# Patient Record
Sex: Male | Born: 1950 | Race: White | Hispanic: No | Marital: Married | Smoking: Former smoker
Health system: Southern US, Community
[De-identification: ages and names within clinical notes are randomized; demographics above are authoritative.]

## PROBLEM LIST (undated history)

## (undated) DIAGNOSIS — I10 Essential (primary) hypertension: Secondary | ICD-10-CM

## (undated) DIAGNOSIS — Z8619 Personal history of other infectious and parasitic diseases: Secondary | ICD-10-CM

## (undated) DIAGNOSIS — I6529 Occlusion and stenosis of unspecified carotid artery: Secondary | ICD-10-CM

## (undated) DIAGNOSIS — E785 Hyperlipidemia, unspecified: Secondary | ICD-10-CM

## (undated) DIAGNOSIS — M109 Gout, unspecified: Secondary | ICD-10-CM

## (undated) DIAGNOSIS — I2510 Atherosclerotic heart disease of native coronary artery without angina pectoris: Secondary | ICD-10-CM

## (undated) HISTORY — DX: Essential (primary) hypertension: I10

## (undated) HISTORY — DX: Gout, unspecified: M10.9

## (undated) HISTORY — DX: Personal history of other infectious and parasitic diseases: Z86.19

## (undated) HISTORY — DX: Atherosclerotic heart disease of native coronary artery without angina pectoris: I25.10

## (undated) HISTORY — DX: Occlusion and stenosis of unspecified carotid artery: I65.29

## (undated) HISTORY — DX: Hyperlipidemia, unspecified: E78.5

---

## 2007-09-16 DIAGNOSIS — Z8619 Personal history of other infectious and parasitic diseases: Secondary | ICD-10-CM

## 2007-09-16 HISTORY — DX: Personal history of other infectious and parasitic diseases: Z86.19

## 2008-03-09 HISTORY — PX: PERCUTANEOUS CORONARY STENT INTERVENTION (PCI-S): SHX6016

## 2008-03-10 ENCOUNTER — Observation Stay (HOSPITAL_COMMUNITY): Admission: AD | Admit: 2008-03-10 | Discharge: 2008-03-11 | Payer: Self-pay | Admitting: Cardiology

## 2011-01-28 NOTE — Cardiovascular Report (Signed)
NAMEARMOND, Reginald NO.:  [id number]   MEDICAL RECORD NO.:  [id number]          PATIENT TYPE:  OIB   LOCATION:  2807                         FACILITY:  [facility]   PHYSICIAN:  Madaline Savage, M.D.DATE OF BIRTH:  12-Mar-1951   DATE OF PROCEDURE:  DATE OF DISCHARGE:                            CARDIAC CATHETERIZATION   PRIMARY CARDIOLOGIST:  Nanetta Batty, MD   PRIMARY CARE PHYSICIAN:  Fenton Malling., MD   OPERATING PHYSICIAN:  Madaline Savage, MD   PROCEDURES PERFORMED:  1. Selective coronary angiography by Judkins technique.  2. Retrograde left heart catheterization.  3. Left ventricular angiography.  4. Direct coronary artery stenting into the proximal and mid LAD.   COMPLICATIONS:  None.   ENTRY SITE:  Right femoral dye used Omnipaque.   PATIENT PROFILE:  [name] is a 60 year old married white gentleman who  is a patient who underwent cardiac evaluation by Dr. Allyson Wright, which  included a nuclear medicine stress test, which was performed on March 08, 2008.  The patient had a history of smoking, hypertension, and chest  pain and his stress test showed moderate to severe ischemia in multiple  anterior wall segments.  His ejection fraction was felt to be 56%.  Dr.  Allyson Wright was very concerned about this gentleman and requested that we add  him on to the cath schedule for today and that was accomplished and we  finished his catheterization, somewhere around 7:00 p.m. this evening.  Those results are described below   RESULTS:  Pressures.  Left ventricular pressure was 150/3 and diastolic  pressure 8, central aortic pressure was 150/80 with a mean of 110.  No  aortic valve gradient by pullback technique.   ANGIOGRAPHIC RESULTS:  The patient's coronary artery anatomy shows no  evidence of pericardial valvular or coronary calcification.  The right  coronary artery is dominant giving rise to a large posterior descending  branch and several posterolateral  branches.  This vessel was clearly a  dominant vessel with circulation.   The left main coronary artery.  The LAD and circumflex system are  relatively small vessels ranging any where from 2.5 to approximately 3  mm in diameter.   Left main coronary artery is fairly short and shows no lesions.   LAD shows a stenotic lesion that is concentrically stenosed, right over  the first septal perforator branch and there are luminal irregularities  between first and second perforator branches.  The more distal LAD  becomes a larger vessel with lumpy-bumpy irregularities.   The circumflex is nondominant.  It is somewhat tortuous.  There is a  large first obtuse marginal branch, which has some smooth tapering  narrowings of up to 50% proximally but the distal vessel is bigger and  approaches 3 mm.  The ongoing left circumflex is nondominant, medium in  size, and shows no lesions.   The left ventricular angiogram shows no evidence of any significant wall  motion abnormalities.  Ejection fraction is estimated 55%.  No mitral  regurgitation noted.  No calcification of the aortic valve or  of the  ascending aorta.   Percutaneous coronary intervention was performed with a 6-French left  Judkins guide catheter, a Prowater wire, and a 2.75 x 50 mm Palmaz  stent, which was directly stented into the proximal and mid LAD.  It was  inflated to approximately at 14 atmospheres of pressure and was then  postdilated with a 2.75 Quantum Maverick balloon 2.75 x 12.  The result  in multiple view showed that the lesion was virtually eradicated by  angiography and the TIMI III flow was preserved.  The stenotic lesion  over the septal was gone and there was a very, very small area of  hypodensity at the end of the 15 mm long stent.  The patient tolerated  procedure well.  There was mild chest pain during the inflations and  there was also some chest pain at the end of the case, when 600 mg of  Plavix was given.   The pain at that time was felt to be Plavix related  and not related to angina.   FINAL DIAGNOSES:  1. Recent chest pain in a patient with tobacco smoking and      hypertension.  2. Essentially single-vessel coronary disease with luminal      irregularities in the proximal obtuse marginal branch and high-      grade stenosis in the proximal left anterior descending.   PLAN:  The patient will be observed overnight, will be a candidate for  discharge tomorrow.  He will follow the excellent care of Dr. Allyson Wright.           ______________________________  Madaline Savage, M.D.     WHG/MEDQ  D:  03/10/2008  T:  03/11/2008  Job:  [id number]   cc:   Fenton Malling., MD  Nanetta Batty, M.D.

## 2011-01-31 NOTE — Discharge Summary (Signed)
Reginald Wright, Reginald Wright                   ACCOUNT NO.:  [id number]   MEDICAL RECORD NO.:  [id number]          PATIENT TYPE:  INP   LOCATION:  6529                         FACILITY:  [facility]   PHYSICIAN:  Madaline Savage, M.D.DATE OF BIRTH:  Jul 12, 1951   DATE OF ADMISSION:  03/10/2008  DATE OF DISCHARGE:  03/11/2008                               DISCHARGE SUMMARY   DISCHARGE DIAGNOSES:  1. Unstable angina.  2. Abnormal Myoview study, positive for ischemia.  3. Coronary artery disease with 95% left anterior descending stenosis,      undergoing drug-eluting Promus stent to the left anterior      descending.  4. Nonobstructive coronary disease, 60% in first obtuse marginal      artery.  5. Mild left ventricular dysfunction, ejection fraction 50%.  6. Hypertension.  7. Hyperlipidemia.  8. Gout.  9. History of positive Helicobacter pylori, was treated prior to      admission.   DISCHARGE CONDITION:  Improved.   PROCEDURES:  Combined left heart catheterization with graft  visualization on March 10, 2008, by Dr. Madaline Savage.   On March 10, 2008, PTCA and stent deployment with a Promus drug-eluting  stent to the 95% stenosis of the LAD.   DISCHARGE MEDICATIONS:  1. Simcor 500/20 one nightly.  2. Aspirin 325 mg daily.  3. Metoprolol 25 mg twice a day.  4. Pylera 125/140/125 three times a day.  5. Uloric 40 mg daily.  6. Nitroglycerin as needed for chest pain.  7. Altace 5 mg daily.  8. Plavix 75 mg 1 daily, do not stop, could cause a heart attack.  9. Zantac 150 mg twice a day instead of Prilosec.   DISCHARGE INSTRUCTIONS:  1. Increase activity slowly.  May shower bathe.  No lifting for 2      days.  No driving for 2 days.  2. Wash catheterization site with soap and water.  Call if any      bleeding, swelling, or drainage.  3. The office will call you with a date for Dr. Hazle Coca appointment.  4. No work for 1 week, see work note.   HISTORY OF PRESENT ILLNESS:  A 60 year old  white married male patient,  came to Dr. Allyson Sabal previously for chest discomfort.  He underwent a  nuclear study that was positive for ischemia.  He came in and talked to  Dr. Allyson Sabal and then was sent into Short Stay, he was pain free at that  time, for cardiac catheterization.  He continues with angina that  awakens him from sleep occurring episodically over the last several  weeks, with diaphoresis, shortness of breath, radiation to neck, lasts 6  to 15 minutes, and relieves with nitroglycerin sublingual.   PAST MEDICAL HISTORY:  Hypertension, hyperlipidemia, gout, and H.  pylori, being treated.   ALLERGIES:  No known allergies.   FAMILY HISTORY, SOCIAL HISTORY, REVIEW OF SYSTEMS:  See H&P.   PHYSICAL EXAMINATION ON DISCHARGE:  Blood pressure 156/75, pulse 66,  respiratory rate 16, temperature 97, and oxygen saturation 98%.  Lungs  are clear.  No  murmur on heart exam.  Abdomen is soft and nontender.  Positive bowel sounds.  Right groin catheterization site, no bruising  and pedal pulses were intact and equal.   LABORATORY VALUES:  Hemoglobin on admission 15.1, hematocrit 43,  platelets 221, and WBC 11.3, did increase to 12.4; at discharge  hemoglobin 12.7, hematocrit 37.  Protime 13.1, INR of 1, and PTT 26.   Chemistries:  Sodium 140, potassium 4.4, chloride 105, CO2 of 25,  glucose 104, BUN 12, creatinine 0.99, that remained stable.  Glucose was  up at times.  CK ranged 42, MB 1.1, and troponin I of 0.03.  Followup CK  was 91, MB 7.9, and troponin 0.24 secondary to procedure.   EKG:  Sinus rhythm with anterolateral ischemia.  On March 11, 2008,  followup EKG, sinus rhythm with no further EKG changes.   HOSPITAL COURSE:  The patient came over, essentially as an outpatient  with a positive nuclear study with episodes of angina.  He was scheduled  for cardiac catheterization, which he underwent, and was found to have  95% LAD stenosis, underwent PTCA and stent deployment, tolerated  the  procedure well, only kept overnight in observation.  By the next day, he  was ambulating without problems, and was felt ready for discharge home.  He will follow up as an outpatient.      Reginald Wright. Reginald Wright, N.P.    ______________________________  Madaline Savage, M.D.    LRI/MEDQ  D:  04/06/2008  T:  04/07/2008  Job:  [id number]   cc:   Madaline Savage, M.D.  Nanetta Batty, M.D.  Pasty Spillers. Ivory Broad, MD

## 2011-06-12 LAB — CBC
HCT: 37.8 — ABNORMAL LOW
Hemoglobin: 12.7 — ABNORMAL LOW
MCV: 90.1
Platelets: 201
Platelets: 221
RDW: 13
RDW: 13

## 2011-06-12 LAB — PROTIME-INR
INR: 1
Prothrombin Time: 13.1

## 2011-06-12 LAB — CARDIAC PANEL(CRET KIN+CKTOT+MB+TROPI)
CK, MB: 7.9 — ABNORMAL HIGH
Troponin I: 0.03

## 2011-06-12 LAB — BASIC METABOLIC PANEL
BUN: 12
BUN: 9
Calcium: 9.9
Creatinine, Ser: 0.99
Creatinine, Ser: 1.08
GFR calc non Af Amer: >60
GFR calc non Af Amer: >60
Glucose, Bld: 104 — ABNORMAL HIGH
Glucose, Bld: 170 — ABNORMAL HIGH
Sodium: 140

## 2011-06-12 LAB — APTT: aPTT: 26

## 2012-11-12 ENCOUNTER — Encounter: Payer: Self-pay | Admitting: Cardiology

## 2012-11-12 DIAGNOSIS — Z8739 Personal history of other diseases of the musculoskeletal system and connective tissue: Secondary | ICD-10-CM

## 2012-11-12 DIAGNOSIS — I10 Essential (primary) hypertension: Secondary | ICD-10-CM

## 2012-11-12 DIAGNOSIS — I2510 Atherosclerotic heart disease of native coronary artery without angina pectoris: Secondary | ICD-10-CM | POA: Insufficient documentation

## 2012-11-12 DIAGNOSIS — E785 Hyperlipidemia, unspecified: Secondary | ICD-10-CM | POA: Insufficient documentation

## 2012-11-12 DIAGNOSIS — I779 Disorder of arteries and arterioles, unspecified: Secondary | ICD-10-CM | POA: Insufficient documentation

## 2013-05-02 ENCOUNTER — Encounter: Payer: Self-pay | Admitting: Cardiovascular Disease

## 2013-05-06 ENCOUNTER — Other Ambulatory Visit: Payer: Self-pay | Admitting: *Deleted

## 2013-05-06 DIAGNOSIS — I779 Disorder of arteries and arterioles, unspecified: Secondary | ICD-10-CM

## 2013-05-10 ENCOUNTER — Telehealth (HOSPITAL_COMMUNITY): Payer: Self-pay | Admitting: Cardiovascular Disease

## 2013-05-19 ENCOUNTER — Telehealth (HOSPITAL_COMMUNITY): Payer: Self-pay | Admitting: Cardiovascular Disease

## 2013-05-24 ENCOUNTER — Telehealth (HOSPITAL_COMMUNITY): Payer: Self-pay | Admitting: *Deleted

## 2013-05-24 ENCOUNTER — Encounter (HOSPITAL_COMMUNITY): Payer: Self-pay | Admitting: *Deleted

## 2013-05-26 ENCOUNTER — Telehealth (HOSPITAL_COMMUNITY): Payer: Self-pay | Admitting: *Deleted

## 2013-05-26 ENCOUNTER — Encounter (HOSPITAL_COMMUNITY): Payer: Self-pay | Admitting: *Deleted

## 2013-06-02 ENCOUNTER — Ambulatory Visit (HOSPITAL_COMMUNITY)
Admission: RE | Admit: 2013-06-02 | Discharge: 2013-06-02 | Disposition: A | Discharge status: Home / Self Care | Payer: BC Managed Care – PPO | Source: Ambulatory Visit | Attending: Internal Medicine | Admitting: Internal Medicine

## 2013-06-02 DIAGNOSIS — I779 Disorder of arteries and arterioles, unspecified: Secondary | ICD-10-CM

## 2013-06-02 NOTE — Progress Notes (Signed)
Carotid Duplex Completed. °Brianna L Mazza,RVT °

## 2013-06-15 ENCOUNTER — Ambulatory Visit (INDEPENDENT_AMBULATORY_CARE_PROVIDER_SITE_OTHER): Payer: BC Managed Care – PPO | Admitting: Cardiovascular Disease

## 2013-06-15 ENCOUNTER — Encounter: Payer: Self-pay | Admitting: Cardiovascular Disease

## 2013-06-15 VITALS — BP 150/80 | HR 66 | Ht 70.0 in | Wt 166.0 lb

## 2013-06-15 DIAGNOSIS — I2510 Atherosclerotic heart disease of native coronary artery without angina pectoris: Secondary | ICD-10-CM

## 2013-06-15 DIAGNOSIS — I10 Essential (primary) hypertension: Secondary | ICD-10-CM

## 2013-06-15 DIAGNOSIS — E785 Hyperlipidemia, unspecified: Secondary | ICD-10-CM

## 2013-06-15 DIAGNOSIS — I779 Disorder of arteries and arterioles, unspecified: Secondary | ICD-10-CM

## 2013-06-15 NOTE — Assessment & Plan Note (Signed)
Moderate right, mild left ICA stenosis stable a duplex ultrasound. He is neurologically asymptomatic on aspirin and Plavix.

## 2013-06-15 NOTE — Assessment & Plan Note (Signed)
Status post proximal LAD PCI and stenting using a drug-eluting stent by Dr. Lavonne Chick 03/10/08. He did have a 60% obtuse marginal branch lesion which elected to treat medically. He had a Myoview stress test performed 06/06/08 is nonischemic. He denies chest pain or shortness of breath.

## 2013-06-15 NOTE — Assessment & Plan Note (Signed)
Under good control on current medications 

## 2013-06-15 NOTE — Assessment & Plan Note (Signed)
Excellent results on statin therapy

## 2013-06-15 NOTE — Patient Instructions (Addendum)
Your physician wants you to follow-up in: 1 year with Dr Berry. You will receive a reminder letter in the mail two months in advance. If you don't receive a letter, please call our office to schedule the follow-up appointment.  

## 2013-06-15 NOTE — Progress Notes (Signed)
06/15/2013 Reginald Wright   [birth year]  [id number]  Primary Physician Dorris Singh, DO Primary Cardiologist: Runell Gess MD Reginald Wright   HPI:  The patient is a 62 year old mildly overweight married Caucasian male father of 1 who I last saw in the office a year ago. He has a history of CAD status post PCI and stenting of his proximal LAD with a Promus drug-eluting stent by Dr. Lavonne Chick on March 10, 2008. He did have a 60% OM lesion which we elected to treat medically. He had a Myoview stress test performed June 06, 2008 that was nonischemic. His other problems include hypertension, hyperlipidemia and peripheral vascular disease with carotid Dopplers performed because of an auscultated bruit in May 2012 that showed moderate right ICA stenosis in the 60% range. He remains neurologically asymptomatic. Most recent Dopplers performed in May showed no change. His most recent lipid profile performed recently revealed a total cholesterol of 143, LDL of 70 and HDL of 61. Since I saw him in the office a year ago he has remained completely asymptomatic. He hasn't retire from his previous employment where he worked for 37 years.     Current Outpatient Prescriptions  Medication Sig Dispense Refill  . allopurinol (ZYLOPRIM) 300 MG tablet Take 300 mg by mouth daily.      Marland Kitchen aspirin 325 MG tablet Take 162 mg by mouth daily.      . clopidogrel (PLAVIX) 75 MG tablet Take 75 mg by mouth daily.      . metoprolol tartrate (LOPRESSOR) 25 MG tablet Take 25 mg by mouth 2 (two) times daily.      . niacin-simvastatin (SIMCOR) 500-20 MG 24 hr tablet Take 1 tablet by mouth at bedtime.      Marland Kitchen NITROSTAT 0.6 MG SL tablet Place 0.6 mg under the tongue every 5 (five) minutes as needed.       . ramipril (ALTACE) 5 MG tablet Take 5 mg by mouth daily.      . ranitidine (ZANTAC) 150 MG capsule Take 150 mg by mouth 2 (two) times daily.       No current facility-administered medications for this visit.     No Known Allergies  History   Social History  . Marital Status: Married    Spouse Name: N/A    Number of Children: 1  . Years of Education: N/A   Occupational History  . retired Producer, television/film/video   Social History Main Topics  . Smoking status: Former Smoker    Quit date: 11/12/1992  . Smokeless tobacco: Not on file  . Alcohol Use: 12.6 oz/week    21 Cans of beer per week  . Drug Use: No  . Sexual Activity: Not on file   Other Topics Concern  . Not on file   Social History Narrative  . No narrative on file     Review of Systems: General: negative for chills, fever, night sweats or weight changes.  Cardiovascular: negative for chest pain, dyspnea on exertion, edema, orthopnea, palpitations, paroxysmal nocturnal dyspnea or shortness of breath Dermatological: negative for rash Respiratory: negative for cough or wheezing Urologic: negative for hematuria Abdominal: negative for nausea, vomiting, diarrhea, bright red blood per rectum, melena, or hematemesis Neurologic: negative for visual changes, syncope, or dizziness All other systems reviewed and are otherwise negative except as noted above.    Blood pressure 150/80, pulse 66, height 5\' 10"  (1.778 m), weight 166 lb (75.297 kg).  General appearance: alert and  no distress Neck: no adenopathy, no carotid bruit, no JVD, supple, symmetrical, trachea midline and thyroid not enlarged, symmetric, no tenderness/mass/nodules Lungs: clear to auscultation bilaterally Heart: regular rate and rhythm, S1, S2 normal, no murmur, click, rub or gallop Extremities: extremities normal, atraumatic, no cyanosis or edema  EKG normal sinus rhythm at 66 with left anterior fascicular block  ASSESSMENT AND PLAN:   CAD (coronary artery disease), hx. promus DES 02/2008 to LAD, neg. nuc 05/2008, residual 60% OM treated medically  Status post proximal LAD PCI and stenting using a drug-eluting stent by Dr. Lavonne Chick 03/10/08. He  did have a 60% obtuse marginal branch lesion which elected to treat medically. He had a Myoview stress test performed 06/06/08 is nonischemic. He denies chest pain or shortness of breath.  HTN (hypertension) Under good control on current medications  Hyperlipidemia Excellent results on statin therapy  Carotid artery disease, mod Rt. ICA Moderate right, mild left ICA stenosis stable a duplex ultrasound. He is neurologically asymptomatic on aspirin and Plavix.      Runell Gess MD FACP,FACC,FAHA, [facility] 06/15/2013 2:59 PM

## 2013-06-17 ENCOUNTER — Telehealth: Payer: Self-pay | Admitting: *Deleted

## 2013-06-17 DIAGNOSIS — I6529 Occlusion and stenosis of unspecified carotid artery: Secondary | ICD-10-CM

## 2013-06-17 NOTE — Telephone Encounter (Signed)
Order placed for repeat carotid doppler in one year 

## 2013-06-17 NOTE — Telephone Encounter (Signed)
Message copied by Marella Bile on Fri Jun 17, 2013 11:24 AM ------      Message from: Runell Gess      Created: Wed Jun 15, 2013  9:02 PM       No change from prior study. Repeat in 12 months. ------

## 2014-05-09 ENCOUNTER — Other Ambulatory Visit (HOSPITAL_COMMUNITY): Payer: Self-pay | Admitting: Cardiovascular Disease

## 2014-05-09 DIAGNOSIS — I6529 Occlusion and stenosis of unspecified carotid artery: Secondary | ICD-10-CM

## 2014-06-07 ENCOUNTER — Ambulatory Visit (HOSPITAL_COMMUNITY)
Admission: RE | Admit: 2014-06-07 | Discharge: 2014-06-07 | Disposition: A | Discharge status: Home / Self Care | Payer: BC Managed Care – PPO | Source: Ambulatory Visit | Attending: Cardiology | Admitting: Cardiology

## 2014-06-07 DIAGNOSIS — I6529 Occlusion and stenosis of unspecified carotid artery: Secondary | ICD-10-CM | POA: Insufficient documentation

## 2014-06-07 NOTE — Progress Notes (Signed)
Carotid Duplex Completed. °Brianna L Mazza,RVT °

## 2014-06-13 ENCOUNTER — Encounter (HOSPITAL_COMMUNITY): Payer: BC Managed Care – PPO

## 2014-06-16 ENCOUNTER — Encounter: Payer: Self-pay | Admitting: *Deleted

## 2014-06-20 ENCOUNTER — Ambulatory Visit: Payer: BC Managed Care – PPO | Admitting: Cardiovascular Disease

## 2014-07-28 ENCOUNTER — Ambulatory Visit (INDEPENDENT_AMBULATORY_CARE_PROVIDER_SITE_OTHER): Payer: BC Managed Care – PPO | Admitting: Cardiovascular Disease

## 2014-07-28 ENCOUNTER — Encounter: Payer: Self-pay | Admitting: Cardiovascular Disease

## 2014-07-28 VITALS — BP 116/60 | HR 57 | Ht 70.0 in | Wt 167.0 lb

## 2014-07-28 DIAGNOSIS — I6529 Occlusion and stenosis of unspecified carotid artery: Secondary | ICD-10-CM

## 2014-07-28 DIAGNOSIS — I779 Disorder of arteries and arterioles, unspecified: Secondary | ICD-10-CM

## 2014-07-28 DIAGNOSIS — E785 Hyperlipidemia, unspecified: Secondary | ICD-10-CM

## 2014-07-28 DIAGNOSIS — I10 Essential (primary) hypertension: Secondary | ICD-10-CM

## 2014-07-28 DIAGNOSIS — I739 Peripheral vascular disease, unspecified: Secondary | ICD-10-CM

## 2014-07-28 DIAGNOSIS — I2510 Atherosclerotic heart disease of native coronary artery without angina pectoris: Secondary | ICD-10-CM

## 2014-07-28 DIAGNOSIS — I2583 Coronary atherosclerosis due to lipid rich plaque: Secondary | ICD-10-CM

## 2014-07-28 NOTE — Assessment & Plan Note (Signed)
History of hypertension with blood pressure today measured at 116/60. He is on metoprolol and ramipril. We will keep him on the same drugs at the same doses

## 2014-07-28 NOTE — Assessment & Plan Note (Signed)
History of carotid artery disease with recent Doppler performed 06/07/14 revealing moderate right ICA stenosis unchanged from the prior study. He is on dual antiplatelet therapy and is neurologically a symptomatically. We will continue to follow his carotids by duplex ultrasound on an annual basis.

## 2014-07-28 NOTE — Patient Instructions (Addendum)
Your physician wants you to follow-up in: January 2017. You will receive a reminder letter in the mail two months in advance. If you don't receive a letter, please call our office to schedule the follow-up appointment.  Carotid Duplex (January 2017)- This test is an ultrasound of the carotid arteries in your neck. It looks at blood flow through these arteries that supply the brain with blood. Allow one hour for this exam. There are no restrictions or special instructions.

## 2014-07-28 NOTE — Assessment & Plan Note (Signed)
History of CAD status post stenting of his proximal LAD by Dr. Janene Madeira 03/10/08 with a Promus drug-eluting stent. He did have a 60% OM lesion which was treated medically. His last Myoview performed 06/06/08 was nonischemic. He denies chest pain or shortness of breath.

## 2014-07-28 NOTE — Assessment & Plan Note (Signed)
History of hyperlipidemia on Simcor. His most recent lipid profile performed 04/21/14 revealed a total cholesterol 152, LDL of 69 and HDL 64. This is excellent for secondary prevention. We'll keep him on his current medications

## 2014-07-28 NOTE — Progress Notes (Signed)
07/28/2014 Ernestene Mention Thull   March 26, 1951  [id number]  Primary Physician Anselmo Pickler, DO Primary Cardiologist: Lorretta Harp MD Renae Gloss   HPI:  The patient is a 63 year old mildly overweight married Caucasian male father of 1 who I last saw in the office a year ago. He has a history of CAD status post PCI and stenting of his proximal LAD with a Promus drug-eluting stent by Dr. Janene Madeira on March 10, 2008. He did have a 60% OM lesion which we elected to treat medically. He had a Myoview stress test performed June 06, 2008 that was nonischemic. His other problems include hypertension, hyperlipidemia and peripheral vascular disease with carotid Dopplers performed because of an auscultated bruit in May 2012 that showed moderate right ICA stenosis in the 60% range. He remains neurologically asymptomatic. His most recent carotid Dopplers performed 06/07/14 remained stable moderate right internal carotid artery stenosis. His most recent lipid profile performed 04/21/14 revealed a total cholesterol 152, LDL 69 and HDL of 64 he is completely asymptomatic.    Current Outpatient Prescriptions  Medication Sig Dispense Refill  . allopurinol (ZYLOPRIM) 300 MG tablet Take 300 mg by mouth daily.    Marland Kitchen aspirin 81 MG tablet Take 81 mg by mouth daily.    . clopidogrel (PLAVIX) 75 MG tablet Take 75 mg by mouth daily.    . metoprolol tartrate (LOPRESSOR) 25 MG tablet Take 25 mg by mouth 2 (two) times daily.    . niacin-simvastatin (SIMCOR) 500-20 MG 24 hr tablet Take 1 tablet by mouth at bedtime.    Marland Kitchen NITROSTAT 0.6 MG SL tablet Place 0.6 mg under the tongue every 5 (five) minutes as needed.     . ramipril (ALTACE) 5 MG tablet Take 5 mg by mouth daily.    . ranitidine (ZANTAC) 150 MG capsule Take 150 mg by mouth 2 (two) times daily.     No current facility-administered medications for this visit.    No Known Allergies  History   Social History  . Marital Status: Married    Spouse  Name: N/A    Number of Children: 1  . Years of Education: N/A   Occupational History  . retired Clinical research associate   Social History Main Topics  . Smoking status: Former Smoker    Quit date: 11/12/1992  . Smokeless tobacco: Not on file  . Alcohol Use: 12.6 oz/week    21 Cans of beer per week  . Drug Use: No  . Sexual Activity: Not on file   Other Topics Concern  . Not on file   Social History Narrative     Review of Systems: General: negative for chills, fever, night sweats or weight changes.  Cardiovascular: negative for chest pain, dyspnea on exertion, edema, orthopnea, palpitations, paroxysmal nocturnal dyspnea or shortness of breath Dermatological: negative for rash Respiratory: negative for cough or wheezing Urologic: negative for hematuria Abdominal: negative for nausea, vomiting, diarrhea, bright red blood per rectum, melena, or hematemesis Neurologic: negative for visual changes, syncope, or dizziness All other systems reviewed and are otherwise negative except as noted above.    Blood pressure 116/60, pulse 57, height 5\' 10"  (1.778 m), weight 167 lb (75.751 kg).  General appearance: alert and no distress Neck: no adenopathy, no JVD, supple, symmetrical, trachea midline, thyroid not enlarged, symmetric, no tenderness/mass/nodules and Soft right carotid bruit Lungs: clear to auscultation bilaterally Heart: regular rate and rhythm, S1, S2 normal, no murmur, click, rub or gallop  Extremities: extremities normal, atraumatic, no cyanosis or edema  EKG sinus bradycardia at 57 with left anterior fascicular block. I personally reviewed this EKG  ASSESSMENT AND PLAN:   CAD (coronary artery disease), hx. promus DES 02/2008 to LAD, neg. nuc 05/2008, residual 60% OM treated medically  History of CAD status post stenting of his proximal LAD by Dr. Janene Madeira 03/10/08 with a Promus drug-eluting stent. He did have a 60% OM lesion which was treated medically. His last  Myoview performed 06/06/08 was nonischemic. He denies chest pain or shortness of breath.  HTN (hypertension) History of hypertension with blood pressure today measured at 116/60. He is on metoprolol and ramipril. We will keep him on the same drugs at the same doses  Carotid artery disease, mod Rt. ICA History of carotid artery disease with recent Doppler performed 06/07/14 revealing moderate right ICA stenosis unchanged from the prior study. He is on dual antiplatelet therapy and is neurologically a symptomatically. We will continue to follow his carotids by duplex ultrasound on an annual basis.  Hyperlipidemia History of hyperlipidemia on Simcor. His most recent lipid profile performed 04/21/14 revealed a total cholesterol 152, LDL of 69 and HDL 64. This is excellent for secondary prevention. We'll keep him on his current medications      Lorretta Harp MD [facility], [facility] 07/28/2014 3:54 PM

## 2014-12-21 ENCOUNTER — Telehealth: Payer: Self-pay | Admitting: Cardiovascular Disease

## 2014-12-21 NOTE — Telephone Encounter (Signed)
Pt needs OK to hold Plavix for upcoming colonoscopy.

## 2014-12-21 NOTE — Telephone Encounter (Signed)
Merrilee Seashore called in wanting to see if the pt could hold his plavix 7 days before a colonoscopy which will be scheduled in May. Please f/u with him  Thanks

## 2014-12-24 NOTE — Telephone Encounter (Signed)
OK to hold plavix for colonoscpoy 

## 2014-12-25 NOTE — Telephone Encounter (Signed)
Spoke w/ Merrilee Seashore at GI office - he voiced understanding, thanks for callback.

## 2015-04-25 ENCOUNTER — Telehealth: Payer: Self-pay | Admitting: Cardiovascular Disease

## 2015-04-25 NOTE — Telephone Encounter (Signed)
Reginald Wright is wanting to know should he discontinue with the Plavix while he is going thru Chemo and Radiation . Please call  Thanks

## 2015-04-25 NOTE — Telephone Encounter (Signed)
      [name] is wanting to know should he discontinue with the Plavix while he is going through Chemo and Radiation .  Oncologist feels that it will be fine but patient would like approval from Dr. Gwenlyn Found  Please advise

## 2015-04-25 NOTE — Telephone Encounter (Signed)
Yes, continue plavix

## 2015-04-25 NOTE — Telephone Encounter (Signed)
Instructed patient to continue Plavix as prescribed

## 2015-07-05 ENCOUNTER — Other Ambulatory Visit: Payer: Self-pay | Admitting: Cardiovascular Disease

## 2015-07-05 DIAGNOSIS — I6523 Occlusion and stenosis of bilateral carotid arteries: Secondary | ICD-10-CM

## 2015-07-12 ENCOUNTER — Ambulatory Visit (HOSPITAL_COMMUNITY)
Admission: RE | Admit: 2015-07-12 | Discharge: 2015-07-12 | Disposition: A | Discharge status: Home / Self Care | Payer: 59 | Source: Ambulatory Visit | Attending: Cardiology | Admitting: Cardiology

## 2015-07-12 DIAGNOSIS — I6523 Occlusion and stenosis of bilateral carotid arteries: Secondary | ICD-10-CM | POA: Diagnosis present

## 2015-07-12 DIAGNOSIS — I10 Essential (primary) hypertension: Secondary | ICD-10-CM | POA: Diagnosis not present

## 2015-07-12 DIAGNOSIS — E785 Hyperlipidemia, unspecified: Secondary | ICD-10-CM | POA: Insufficient documentation

## 2015-07-20 ENCOUNTER — Telehealth: Payer: Self-pay | Admitting: Cardiovascular Disease

## 2015-07-20 NOTE — Telephone Encounter (Signed)
Received records from [place] for appointment with Dr Gwenlyn Found on 07/31/15.  Records given to [facility] (medical records) for appointment on 07/31/15.  lp

## 2015-07-26 ENCOUNTER — Encounter: Payer: Self-pay | Admitting: Cardiovascular Disease

## 2015-07-31 ENCOUNTER — Encounter: Payer: Self-pay | Admitting: Cardiovascular Disease

## 2015-07-31 ENCOUNTER — Telehealth (HOSPITAL_COMMUNITY): Payer: Self-pay | Admitting: *Deleted

## 2015-07-31 ENCOUNTER — Ambulatory Visit (INDEPENDENT_AMBULATORY_CARE_PROVIDER_SITE_OTHER): Payer: 59 | Admitting: Cardiovascular Disease

## 2015-07-31 VITALS — BP 134/70 | HR 62 | Ht 70.0 in | Wt 165.0 lb

## 2015-07-31 DIAGNOSIS — Z01818 Encounter for other preprocedural examination: Secondary | ICD-10-CM | POA: Diagnosis not present

## 2015-07-31 DIAGNOSIS — I10 Essential (primary) hypertension: Secondary | ICD-10-CM | POA: Diagnosis not present

## 2015-07-31 DIAGNOSIS — I779 Disorder of arteries and arterioles, unspecified: Secondary | ICD-10-CM

## 2015-07-31 DIAGNOSIS — I739 Peripheral vascular disease, unspecified: Secondary | ICD-10-CM

## 2015-07-31 DIAGNOSIS — E785 Hyperlipidemia, unspecified: Secondary | ICD-10-CM | POA: Diagnosis not present

## 2015-07-31 DIAGNOSIS — I2583 Coronary atherosclerosis due to lipid rich plaque: Secondary | ICD-10-CM

## 2015-07-31 DIAGNOSIS — I2510 Atherosclerotic heart disease of native coronary artery without angina pectoris: Secondary | ICD-10-CM

## 2015-07-31 NOTE — Assessment & Plan Note (Signed)
History of hyperlipidemia currently not taking his statin drug at the recommendation of his PCP

## 2015-07-31 NOTE — Assessment & Plan Note (Signed)
History of hypertension blood pressure measurements at 134/70. He is on metoprolol and ramipril. Continue current meds at current dosing

## 2015-07-31 NOTE — Assessment & Plan Note (Signed)
History of moderate right ICA stenosis followed by duplex ultrasound most recently in our office 07/12/15. He is neurologically symptomatically. We will continue to follow this by ultrasound annually

## 2015-07-31 NOTE — Patient Instructions (Signed)
Medication Instructions:  Your physician recommends that you continue on your current medications as directed. Please refer to the Current Medication list given to you today.   Labwork: I will get your lab work from your Primary Care Physician.   Testing/Procedures: Your physician has requested that you have en exercise stress myoview. For further information please visit [email or web address]. Please follow instruction sheet, as given.   Follow-Up: Your physician wants you to follow-up in: 12 months with Dr. Gwenlyn Found. You will receive a reminder letter in the mail two months in advance. If you don't receive a letter, please call our office to schedule the follow-up appointment.   Any Other Special Instructions Will Be Listed Below (If Applicable).     If you need a refill on your cardiac medications before your next appointment, please call your pharmacy.

## 2015-07-31 NOTE — Telephone Encounter (Signed)
Patient given detailed instructions per Myocardial Perfusion Study Information Sheet for the test on 08/02/15 at 1230. Patient notified to arrive 15 minutes early and that it is imperative to arrive on time for appointment to keep from having the test rescheduled.  If you need to cancel or reschedule your appointment, please call the office within 24 hours of your appointment. Failure to do so may result in a cancellation of your appointment, and a $50 no show fee. Patient verbalized understanding.Tamiko Leopard, Ranae Palms

## 2015-07-31 NOTE — Progress Notes (Signed)
07/31/2015 Ernestene Mention [place]   [birth year]  [id number]  Primary Physician Daphene Calamity, MD Primary Cardiologist: Lorretta Harp MD Renae Gloss   HPI:  The patient is a 64 year old mildly overweight married Caucasian male father of 1 who I last saw in the office a year ago. He has a history of CAD status post PCI and stenting of his proximal LAD with a Promus drug-eluting stent by Dr. Janene Madeira on March 10, 2008. He did have a 60% OM lesion which we elected to treat medically. He had a Myoview stress test performed June 06, 2008 that was nonischemic. His other problems include hypertension, hyperlipidemia and peripheral vascular disease with carotid Dopplers performed because of an auscultated bruit in May 2012 that showed moderate right ICA stenosis in the 60% range. He remains neurologically asymptomatic. His most recent carotid Dopplers performed 07/12/15 remained stable moderate right internal carotid artery stenosis. His lipid profile followed by his PCP. Unfortunately, he was diagnosed with stage II anorectal cancer in June. He's had limited surgical resection, chemotherapy and radiation therapy and scheduled to have a divergent colostomy on 08/10/15. He denies chest pain or shortness of breath.   Current Outpatient Prescriptions  Medication Sig Dispense Refill  . allopurinol (ZYLOPRIM) 300 MG tablet Take 300 mg by mouth daily.    Marland Kitchen aspirin 81 MG tablet Take 81 mg by mouth daily.    . clopidogrel (PLAVIX) 75 MG tablet Take 75 mg by mouth daily.    . metoprolol tartrate (LOPRESSOR) 25 MG tablet Take 25 mg by mouth 2 (two) times daily.    . niacin-simvastatin (SIMCOR) 500-20 MG 24 hr tablet Take 1 tablet by mouth at bedtime.    Marland Kitchen NITROSTAT 0.6 MG SL tablet Place 0.6 mg under the tongue every 5 (five) minutes as needed.     . ramipril (ALTACE) 5 MG tablet Take 5 mg by mouth daily.    . ranitidine (ZANTAC) 150 MG capsule Take 150 mg by mouth 2 (two) times daily.      No current facility-administered medications for this visit.    No Known Allergies  Social History   Social History  . Marital Status: Married    Spouse Name: N/A  . Number of Children: 1  . Years of Education: N/A   Occupational History  . retired Clinical research associate   Social History Main Topics  . Smoking status: Former Smoker    Quit date: 11/12/1992  . Smokeless tobacco: Not on file  . Alcohol Use: 12.6 oz/week    21 Cans of beer per week  . Drug Use: No  . Sexual Activity: Not on file   Other Topics Concern  . Not on file   Social History Narrative     Review of Systems: General: negative for chills, fever, night sweats or weight changes.  Cardiovascular: negative for chest pain, dyspnea on exertion, edema, orthopnea, palpitations, paroxysmal nocturnal dyspnea or shortness of breath Dermatological: negative for rash Respiratory: negative for cough or wheezing Urologic: negative for hematuria Abdominal: negative for nausea, vomiting, diarrhea, bright red blood per rectum, melena, or hematemesis Neurologic: negative for visual changes, syncope, or dizziness All other systems reviewed and are otherwise negative except as noted above.    Blood pressure 134/70, pulse 62, height 5\' 10"  (1.778 m), weight 165 lb (74.844 kg).  General appearance: alert and no distress Neck: no adenopathy, no JVD, supple, symmetrical, trachea midline, thyroid not enlarged, symmetric, no tenderness/mass/nodules and soft right  carotid bruit Lungs: clear to auscultation bilaterally Heart: regular rate and rhythm, S1, S2 normal, no murmur, click, rub or gallop Extremities: extremities normal, atraumatic, no cyanosis or edema  EKG sinus rhythm at 62 without ST or T-wave changes. I personally reviewed this EKG  ASSESSMENT AND PLAN:   Hyperlipidemia History of hyperlipidemia currently not taking his statin drug at the recommendation of his PCP  HTN (hypertension) History  of hypertension blood pressure measurements at 134/70. He is on metoprolol and ramipril. Continue current meds at current dosing  Carotid artery disease, mod Rt. ICA History of moderate right ICA stenosis followed by duplex ultrasound most recently in our office 07/12/15. He is neurologically symptomatically. We will continue to follow this by ultrasound annually  CAD (coronary artery disease), hx. promus DES 02/2008 to LAD, neg. nuc 05/2008, residual 60% OM treated medically  History of CAD status post proximal LAD PCI and stenting using a drug eluding stent by Dr. Janene Madeira 03/10/08. He did have a 60% obtuse marginal branch lesion which we elected to treat medically. His last Myoview performed 06/04/08 was nonischemic. He scheduled to have anorectal surgery for cancer on the 25th of this month. We'll get an exercise Myoview to risk stratify him.      Lorretta Harp MD FACP,FACC,FAHA, [facility] 07/31/2015 8:27 AM

## 2015-07-31 NOTE — Assessment & Plan Note (Signed)
History of CAD status post proximal LAD PCI and stenting using a drug eluding stent by Dr. Janene Madeira 03/10/08. He did have a 60% obtuse marginal branch lesion which we elected to treat medically. His last Myoview performed 06/04/08 was nonischemic. He scheduled to have anorectal surgery for cancer on the 25th of this month. We'll get an exercise Myoview to risk stratify him.

## 2015-08-02 ENCOUNTER — Ambulatory Visit (HOSPITAL_COMMUNITY): Payer: 59 | Attending: Cardiovascular Disease

## 2015-08-02 DIAGNOSIS — Z01818 Encounter for other preprocedural examination: Secondary | ICD-10-CM | POA: Diagnosis not present

## 2015-08-02 DIAGNOSIS — I2510 Atherosclerotic heart disease of native coronary artery without angina pectoris: Secondary | ICD-10-CM

## 2015-08-02 DIAGNOSIS — I10 Essential (primary) hypertension: Secondary | ICD-10-CM | POA: Insufficient documentation

## 2015-08-02 DIAGNOSIS — I779 Disorder of arteries and arterioles, unspecified: Secondary | ICD-10-CM | POA: Diagnosis not present

## 2015-08-02 LAB — MYOCARDIAL PERFUSION IMAGING
CHL CUP MPHR: 156 {beats}/min
CHL CUP NUCLEAR SDS: 0
CHL CUP NUCLEAR SRS: 4
CHL CUP NUCLEAR SSS: 4
CHL RATE OF PERCEIVED EXERTION: 17
CSEPEW: 8.5 METS
CSEPHR: 93 %
Exercise duration (min): 7 min
Exercise duration (sec): 0 s
LHR: 0.28
LV sys vol: 29 mL
LVDIAVOL: 83 mL
Peak HR: 146 {beats}/min
Rest HR: 71 {beats}/min
TID: 0.89

## 2015-08-02 MED ORDER — TECHNETIUM TC 99M SESTAMIBI GENERIC - CARDIOLITE
11.0000 | Freq: Once | INTRAVENOUS | Status: AC | PRN
  Administered 2015-08-02: 11 via INTRAVENOUS

## 2015-08-02 MED ORDER — TECHNETIUM TC 99M SESTAMIBI GENERIC - CARDIOLITE
32.5000 | Freq: Once | INTRAVENOUS | Status: AC | PRN
  Administered 2015-08-02: 33 via INTRAVENOUS

## 2015-08-03 ENCOUNTER — Telehealth: Payer: Self-pay | Admitting: Cardiovascular Disease

## 2015-08-03 NOTE — Telephone Encounter (Signed)
Follow up ° °Pt returned call  °

## 2015-08-03 NOTE — Telephone Encounter (Signed)
Left message for patient to call back  

## 2015-08-03 NOTE — Telephone Encounter (Signed)
Pt is calling back in to speak with Maudie Mercury, Dr. Kennon Holter nurse  Thanks

## 2015-08-03 NOTE — Telephone Encounter (Signed)
Returning your call from yesterday. °

## 2015-08-03 NOTE — Telephone Encounter (Signed)
Pt made aware of results no questions at this time. Pt was low risk on Myoview, is pt cleared for surgery?  [place]  [phone] Fax: [phone] Attn: Alyson Locket Dr. Beverely Pace

## 2015-08-05 NOTE — Telephone Encounter (Signed)
Cleared for surgery at low risk, OK to interrupt anti playelet Rx

## 2015-08-06 NOTE — Telephone Encounter (Signed)
Pt clearance sent to Dr. Sharin Grave office.

## 2015-08-07 ENCOUNTER — Ambulatory Visit: Payer: 59 | Admitting: Cardiovascular Disease

## 2015-09-04 ENCOUNTER — Telehealth: Payer: Self-pay | Admitting: Cardiovascular Disease

## 2015-09-04 NOTE — Telephone Encounter (Signed)
Please call,question about his medicine. °

## 2015-09-04 NOTE — Telephone Encounter (Signed)
Recommendations given to patient, he is aware to call if any further concerns.

## 2015-09-04 NOTE — Telephone Encounter (Signed)
OK to stay off of plavix

## 2015-09-04 NOTE — Telephone Encounter (Signed)
Left message for patient to call.

## 2015-09-04 NOTE — Telephone Encounter (Signed)
Pt w hx DES placed in 2009. Notes he has been off Plavix since major surgery last month, wants to know whether or not to resume again.  States he has been scheduled to start chemotherapy in January - wonders if the Plavix needs to be restarted at this time -  Pt concerned for complications/interactions rt med & would prefer to minimize the number of meds he is on daily.  Pt aware I will defer to Dr. Gwenlyn Found.

## 2016-06-06 ENCOUNTER — Other Ambulatory Visit: Payer: Self-pay | Admitting: Cardiovascular Disease

## 2016-06-06 DIAGNOSIS — I6523 Occlusion and stenosis of bilateral carotid arteries: Secondary | ICD-10-CM

## 2016-07-14 ENCOUNTER — Ambulatory Visit (HOSPITAL_COMMUNITY)
Admission: RE | Admit: 2016-07-14 | Discharge: 2016-07-14 | Disposition: A | Discharge status: Home / Self Care | Payer: Medicare Other | Source: Ambulatory Visit | Attending: Cardiology | Admitting: Cardiology

## 2016-07-14 DIAGNOSIS — Z87891 Personal history of nicotine dependence: Secondary | ICD-10-CM | POA: Diagnosis not present

## 2016-07-14 DIAGNOSIS — I6523 Occlusion and stenosis of bilateral carotid arteries: Secondary | ICD-10-CM | POA: Diagnosis present

## 2016-07-14 DIAGNOSIS — E785 Hyperlipidemia, unspecified: Secondary | ICD-10-CM | POA: Insufficient documentation

## 2016-07-14 DIAGNOSIS — I2510 Atherosclerotic heart disease of native coronary artery without angina pectoris: Secondary | ICD-10-CM | POA: Insufficient documentation

## 2016-07-14 DIAGNOSIS — I10 Essential (primary) hypertension: Secondary | ICD-10-CM | POA: Insufficient documentation

## 2016-07-22 ENCOUNTER — Telehealth: Payer: Self-pay | Admitting: *Deleted

## 2016-07-22 DIAGNOSIS — I779 Disorder of arteries and arterioles, unspecified: Secondary | ICD-10-CM

## 2016-07-22 DIAGNOSIS — I739 Peripheral vascular disease, unspecified: Principal | ICD-10-CM

## 2016-07-22 NOTE — Telephone Encounter (Signed)
-----   Message from Lorretta Harp, MD sent at 07/20/2016  4:54 PM EST ----- No change from prior study. Repeat in 12 months.

## 2016-08-26 ENCOUNTER — Ambulatory Visit: Payer: Medicare Other | Admitting: Cardiovascular Disease

## 2016-09-10 ENCOUNTER — Telehealth: Payer: Self-pay | Admitting: Cardiovascular Disease

## 2016-09-10 NOTE — Telephone Encounter (Signed)
Received records from [facility] for appointment on 09/24/16 with Dr Gwenlyn Found.  Records given to [facility] (medical records) for Dr Kennon Holter schedule on 09/24/16. lp

## 2016-09-16 ENCOUNTER — Other Ambulatory Visit: Payer: Self-pay

## 2016-09-24 ENCOUNTER — Encounter: Payer: Self-pay | Admitting: Cardiovascular Disease

## 2016-09-24 ENCOUNTER — Ambulatory Visit (INDEPENDENT_AMBULATORY_CARE_PROVIDER_SITE_OTHER): Payer: Medicare Other | Admitting: Cardiovascular Disease

## 2016-09-24 VITALS — BP 130/68 | HR 71 | Ht 70.0 in | Wt 167.0 lb

## 2016-09-24 DIAGNOSIS — I779 Disorder of arteries and arterioles, unspecified: Secondary | ICD-10-CM | POA: Diagnosis not present

## 2016-09-24 DIAGNOSIS — I2510 Atherosclerotic heart disease of native coronary artery without angina pectoris: Secondary | ICD-10-CM | POA: Diagnosis not present

## 2016-09-24 DIAGNOSIS — I10 Essential (primary) hypertension: Secondary | ICD-10-CM

## 2016-09-24 DIAGNOSIS — I739 Peripheral vascular disease, unspecified: Secondary | ICD-10-CM

## 2016-09-24 DIAGNOSIS — E7800 Pure hypercholesterolemia, unspecified: Secondary | ICD-10-CM

## 2016-09-24 NOTE — Progress Notes (Signed)
09/24/2016 Ernestene Mention [place]   11-14-1950  [id number]  Primary Physician Daphene Calamity, MD Primary Cardiologist: Lorretta Harp MD Reginald Wright  HPI:  The patient is a 66 year old mildly overweight married Caucasian male father of 1 who I last saw in the office 07/31/15. He has a history of CAD status post PCI and stenting of his proximal LAD with a Promus drug-eluting stent by Dr. Janene Madeira on March 10, 2008. He did have a 60% OM lesion which we elected to treat medically. He had a Myoview stress test performed June 06, 2008 that was nonischemic. His other problems include hypertension, hyperlipidemia and peripheral vascular disease with carotid Dopplers performed because of an auscultated bruit in May 2012 that showed moderate right ICA stenosis in the 60% range. He remains neurologically asymptomatic. His most recent carotid Dopplers performed 07/12/15 remained stable moderate right internal carotid artery stenosis. His lipid profile followed by his PCP. Unfortunately, he was diagnosed with stage II anorectal cancer in June. He's had limited surgical resection, chemotherapy and radiation therapy and had a diverging colostomy on 08/10/15. He denies chest pain or shortness of breath. He did have a Myoview stress test performed for preoperative clearance 08/02/15 which was nonischemic.   Current Outpatient Prescriptions  Medication Sig Dispense Refill  . allopurinol (ZYLOPRIM) 300 MG tablet Take 300 mg by mouth daily.    Marland Kitchen aspirin 81 MG tablet Take 81 mg by mouth daily.    . metoprolol tartrate (LOPRESSOR) 25 MG tablet Take 25 mg by mouth 2 (two) times daily.    Marland Kitchen NITROSTAT 0.6 MG SL tablet Place 0.6 mg under the tongue every 5 (five) minutes as needed.     . ondansetron (ZOFRAN-ODT) 4 MG disintegrating tablet DIS 1 T ON THE TONGUE Q 8 H PRN NAUSEA OR VOM  0  . ramipril (ALTACE) 5 MG tablet Take 5 mg by mouth daily.    . tamsulosin (FLOMAX) 0.4 MG CAPS capsule   6   No  current facility-administered medications for this visit.     No Known Allergies  Social History   Social History  . Marital status: Married    Spouse name: N/A  . Number of children: 1  . Years of education: N/A   Occupational History  . retired Clinical research associate   Social History Main Topics  . Smoking status: Former Smoker    Quit date: 11/12/1992  . Smokeless tobacco: Never Used  . Alcohol use 12.6 oz/week    21 Cans of beer per week  . Drug use: No  . Sexual activity: Not on file   Other Topics Concern  . Not on file   Social History Narrative  . No narrative on file     Review of Systems: General: negative for chills, fever, night sweats or weight changes.  Cardiovascular: negative for chest pain, dyspnea on exertion, edema, orthopnea, palpitations, paroxysmal nocturnal dyspnea or shortness of breath Dermatological: negative for rash Respiratory: negative for cough or wheezing Urologic: negative for hematuria Abdominal: negative for nausea, vomiting, diarrhea, bright red blood per rectum, melena, or hematemesis Neurologic: negative for visual changes, syncope, or dizziness All other systems reviewed and are otherwise negative except as noted above.    Blood pressure 130/68, pulse 71, height 5\' 10"  (1.778 m), weight 167 lb (75.8 kg).  General appearance: alert and no distress Neck: no adenopathy, no carotid bruit, no JVD, supple, symmetrical, trachea midline and thyroid not enlarged, symmetric,  no tenderness/mass/nodules Lungs: clear to auscultation bilaterally Heart: regular rate and rhythm, S1, S2 normal, no murmur, click, rub or gallop Extremities: extremities normal, atraumatic, no cyanosis or edema  EKG normal sinus rhythm at 71 with left anterior vesicular block and lateral Q waves. I personally reviewed his EKG  ASSESSMENT AND PLAN:   CAD (coronary artery disease), hx. promus DES 02/2008 to LAD, neg. nuc 05/2008, residual 60% OM treated  medically  History of coronary artery disease status post LAD PCI and drug-eluting stenting by Dr. Janene Madeira 03/10/08. He did have a 60% obtuse marginal branch stenosis which was elected to treat medically. His most recent Myoview stress test done for preoperative clearance 08/02/15 was nonischemic. He denies chest pain or shortness of breath.  HTN (hypertension) History of hypertension blood pressure measures had 130/68. He is on metoprolol and enalapril. Continue current meds at current dosing  Hyperlipidemia History of hyperlipidemia not on statin therapy followed by his PCP  Carotid artery disease, mod Rt. ICA History of moderate right ICA stenosis at its ultrasound left chest 07/14/16. He is scheduled to have this rechecked in October of this year. He has neurologically asymptomatic.      Lorretta Harp MD FACP,FACC,FAHA, [facility] 09/24/2016 3:54 PM

## 2016-09-24 NOTE — Assessment & Plan Note (Signed)
History of hypertension blood pressure measures had 130/68. He is on metoprolol and enalapril. Continue current meds at current dosing

## 2016-09-24 NOTE — Assessment & Plan Note (Signed)
History of moderate right ICA stenosis at its ultrasound left chest 07/14/16. He is scheduled to have this rechecked in October of this year. He has neurologically asymptomatic.

## 2016-09-24 NOTE — Assessment & Plan Note (Signed)
History of hyperlipidemia not on statin therapy followed by his PCP 

## 2016-09-24 NOTE — Assessment & Plan Note (Signed)
History of coronary artery disease status post LAD PCI and drug-eluting stenting by Dr. Janene Madeira 03/10/08. He did have a 60% obtuse marginal branch stenosis which was elected to treat medically. His most recent Myoview stress test done for preoperative clearance 08/02/15 was nonischemic. He denies chest pain or shortness of breath.

## 2016-09-24 NOTE — Patient Instructions (Signed)
Medication Instructions: Your physician recommends that you continue on your current medications as directed. Please refer to the Current Medication list given to you today.   Labwork: I will request labs from Dr. Shelda Altes  Testing:  Schedule Carotid duplex for 06/2017   Follow-Up: Your physician wants you to follow-up in: 1 year with Dr. Gwenlyn Found. You will receive a reminder letter in the mail two months in advance. If you don't receive a letter, please call our office to schedule the follow-up appointment.  If you need a refill on your cardiac medications before your next appointment, please call your pharmacy.

## 2016-11-16 IMAGING — NM NM MYOCAR MULTI W/ SPECT
3 series · 18 of 18 positions shown · non-contrast
Comparison: none

[Series 1: rest · 6.51mm/px · 6 of 64 frames shown]
[frame 6/64]
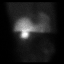
[frame 16/64]
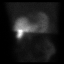
[frame 27/64]
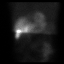
[frame 38/64]
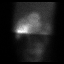
[frame 48/64]
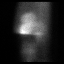
[frame 59/64]
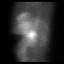

[Series 2: stress · 6.51mm/px · 6 of 64 frames shown (1 of 2)]
[frame 6/64]
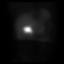
[frame 16/64]
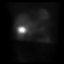
[frame 27/64]
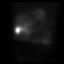
[frame 38/64]
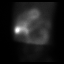
[frame 48/64]
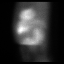
[frame 59/64]
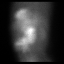

[Series 2: stress · 6.51mm/px · 6 of 512 frames shown (2 of 2)]
[frame 43/512]
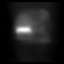
[frame 128/512]
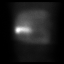
[frame 214/512]
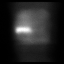
[frame 299/512]
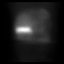
[frame 384/512]
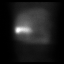
[frame 470/512]
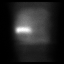

[18 of 18 positions shown; findings below may reference images not displayed]

Canned report from images found in remote index.

Refer to host system for actual result text.

## 2017-07-06 ENCOUNTER — Inpatient Hospital Stay (HOSPITAL_COMMUNITY): Admission: RE | Admit: 2017-07-06 | Payer: Medicare Other | Source: Ambulatory Visit

## 2017-08-12 ENCOUNTER — Ambulatory Visit (HOSPITAL_COMMUNITY)
Admission: RE | Admit: 2017-08-12 | Discharge: 2017-08-12 | Disposition: A | Discharge status: Home / Self Care | Payer: Medicare Other | Source: Ambulatory Visit | Attending: Cardiology | Admitting: Cardiology

## 2017-08-12 DIAGNOSIS — I779 Disorder of arteries and arterioles, unspecified: Secondary | ICD-10-CM

## 2017-08-12 DIAGNOSIS — I6523 Occlusion and stenosis of bilateral carotid arteries: Secondary | ICD-10-CM | POA: Diagnosis not present

## 2017-08-12 DIAGNOSIS — I739 Peripheral vascular disease, unspecified: Secondary | ICD-10-CM

## 2017-08-14 ENCOUNTER — Other Ambulatory Visit: Payer: Self-pay | Admitting: Cardiovascular Disease

## 2017-08-14 DIAGNOSIS — I6521 Occlusion and stenosis of right carotid artery: Secondary | ICD-10-CM

## 2018-08-18 ENCOUNTER — Ambulatory Visit (HOSPITAL_COMMUNITY)
Admission: RE | Admit: 2018-08-18 | Discharge: 2018-08-18 | Disposition: A | Discharge status: Home / Self Care | Payer: Medicare Other | Source: Ambulatory Visit | Attending: Cardiology | Admitting: Cardiology

## 2018-08-18 ENCOUNTER — Other Ambulatory Visit: Payer: Self-pay | Admitting: *Deleted

## 2018-08-18 DIAGNOSIS — I6521 Occlusion and stenosis of right carotid artery: Secondary | ICD-10-CM | POA: Insufficient documentation

## 2019-08-19 ENCOUNTER — Encounter (HOSPITAL_COMMUNITY): Payer: Medicare Other

## 2019-10-04 ENCOUNTER — Other Ambulatory Visit: Payer: Self-pay

## 2019-10-04 ENCOUNTER — Other Ambulatory Visit (HOSPITAL_COMMUNITY): Payer: Self-pay | Admitting: Cardiovascular Disease

## 2019-10-04 ENCOUNTER — Ambulatory Visit (HOSPITAL_COMMUNITY)
Admission: RE | Admit: 2019-10-04 | Discharge: 2019-10-04 | Disposition: A | Discharge status: Home / Self Care | Payer: Medicare Other | Source: Ambulatory Visit | Attending: Cardiovascular Disease | Admitting: Cardiovascular Disease

## 2019-10-04 DIAGNOSIS — I6521 Occlusion and stenosis of right carotid artery: Secondary | ICD-10-CM | POA: Diagnosis not present

## 2019-10-04 DIAGNOSIS — I6523 Occlusion and stenosis of bilateral carotid arteries: Secondary | ICD-10-CM

## 2020-08-29 ENCOUNTER — Other Ambulatory Visit: Payer: Self-pay | Admitting: Hematology and Oncology

## 2020-08-29 DIAGNOSIS — C787 Secondary malignant neoplasm of liver and intrahepatic bile duct: Secondary | ICD-10-CM

## 2020-08-30 ENCOUNTER — Ambulatory Visit
Admission: RE | Admit: 2020-08-30 | Discharge: 2020-08-30 | Disposition: A | Discharge status: Home / Self Care | Payer: Medicare Other | Source: Ambulatory Visit | Attending: Hematology and Oncology | Admitting: Hematology and Oncology

## 2020-08-30 ENCOUNTER — Other Ambulatory Visit: Payer: Self-pay

## 2020-08-30 ENCOUNTER — Encounter: Payer: Self-pay | Admitting: *Deleted

## 2020-08-30 DIAGNOSIS — C20 Malignant neoplasm of rectum: Secondary | ICD-10-CM

## 2020-08-30 HISTORY — PX: IR RADIOLOGIST EVAL & MGMT: IMG5224

## 2020-08-30 NOTE — Consult Note (Signed)
Chief Complaint: Metastatic rectal cancer.  Referring Physician(s): Chinnasami,Bernard  History of Present Illness: Reginald Wright is a 69 y.o. male with past medical history significant for CAD (post coronary stent placement), carotid stenosis, hypertension and hyperlipidemia who is seen today in telemedicine consultation evaluation of potential percutaneous management for rectal cancer, metastatic to the liver.  Hehas had neoadjuvant chemoradiation therapy in the past in the adjuvant settingandFOLOFOX therapy post resection. Unfortunately now has stage IV diseaseand isonAvastin plus FOLFIRI therapy however despite treatment, recent PET/CT demonstrates progression of size and metabolic activity associated with the solitary lesion replacing majority of the left lobe of the liver.  Negative for definitive evidence of extrahepatic metastatic disease as left upper lobe pulmonary nodule is currently without metabolic activity.  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  He states that the symptoms are much improved as of late as he has undergone a chemotherapy holiday.  Patient is interested in pursuing all available treatment options.    Past Medical History:  Diagnosis Date  . CAD (coronary artery disease)    HX. stent 2009 (also last cath), last nuc.stress 06/06/2008 improved, non ischemic secondary to stent and EF 66%   . Carotid stenosis, asymptomatic    mod. Rt. ICA -60% by doppler, + catotid bruit  . Gout   . History of Helicobacter pylori infection 2009   treated  . HTN (hypertension)   . Hyperlipidemia     Past Surgical History:  Procedure Laterality Date  . PERCUTANEOUS CORONARY STENT INTERVENTION (PCI-S)  03/09/2008   Promus, DES to LAD, residual disease 60% OM lesion treated medically    Allergies: Patient has no known allergies.  Medications: Prior to  Admission medications   Medication Sig Start Date End Date Taking? Authorizing Provider  allopurinol (ZYLOPRIM) 300 MG tablet Take 300 mg by mouth daily.    [provider]  aspirin 81 MG tablet Take 81 mg by mouth daily.    [provider]  metoprolol tartrate (LOPRESSOR) 25 MG tablet Take 25 mg by mouth 2 (two) times daily.    [provider]  NITROSTAT 0.6 MG SL tablet Place 0.6 mg under the tongue every 5 (five) minutes as needed.  04/28/13   [provider]  ondansetron (ZOFRAN-ODT) 4 MG disintegrating tablet DIS 1 T ON THE TONGUE Q 8 H PRN NAUSEA OR VOM 07/30/16   [provider]  ramipril (ALTACE) 5 MG tablet Take 5 mg by mouth daily.    [provider]  tamsulosin (FLOMAX) 0.4 MG CAPS capsule  08/17/16   [provider]     No family history on file.  Social History   Socioeconomic History  . Marital status: Married    Spouse name: Not on file  . Number of children: 1  . Years of education: Not on file  . Highest education level: Not on file  Occupational History  . Occupation: retired Ambulance person: [employer]  Tobacco Use  . Smoking status: Former Smoker    Quit date: 11/12/1992    Years since quitting: 27.8  . Smokeless tobacco: Never Used  Substance and Sexual Activity  . Alcohol use: Yes    Alcohol/week: 21.0 standard drinks    Types: 21 Cans of beer per week  . Drug use: No  . Sexual activity: Not on file  Other Topics Concern  . Not on  file  Social History Narrative  . Not on file   Social Determinants of Health   Financial Resource Strain: Not on file  Food Insecurity: Not on file  Transportation Needs: Not on file  Physical Activity: Not on file  Stress: Not on file  Social Connections: Not on file    ECOG Status: 2 - Symptomatic, <50% confined to bed  Review of Systems  Review of Systems: A 12 point ROS discussed and pertinent positives are indicated in the HPI  above.  All other systems are negative.  Physical Exam No direct physical exam was performed (except for noted visual exam findings with Video Visits).   Vital Signs: There were no vitals taken for this visit.  Imaging:  PET CT - 08/17/2020; 05/15/2020; 01/31/2020  PET/CT demonstrates interval increase in size of the now approximately 8.3 x 5.3 cm hypoattenuating peripherally hypermetabolic lesion involving the majority of the left lobe of the liver, previously, 4.7 x 3.5 cm on PET/CT performed 05/15/2020.  There is a minimal amount of atherosclerotic plaque involving the origin of the common trunk of the SMA and celiac artery.  Moderate amount of mixed calcified and noncalcified atherosclerotic plaque throughout the normal caliber abdominal aorta, including evaluating this noncontrast examination.   Labs:  CBC: No results for input(s): WBC, HGB, HCT, PLT in the last 8760 hours.  COAGS: No results for input(s): INR, APTT in the last 8760 hours.  BMP: No results for input(s): NA, K, CL, CO2, GLUCOSE, BUN, CALCIUM, CREATININE, GFRNONAA, GFRAA in the last 8760 hours.  Invalid input(s): CMP  LIVER FUNCTION TESTS:  Bilirubin - 0.4 (08/27/2020)   TUMOR MARKERS: No results for input(s): AFPTM, CEA, CA199, CHROMGRNA in the last 8760 hours.  Assessment and Plan:  Reginald Wright is a 69 y.o. male with past medical history significant for CAD (post coronary stent placement), carotid stenosis, hypertension and hyperlipidemia who is seen today in telemedicine consultation evaluation of potential percutaneous management for rectal cancer, metastatic to the liver.  Personal review of PET/CT performed 08/17/2020 demonstrates interval increase in size of the now approximately 8.3 x 5.3 cm hypoattenuating peripherally hypermetabolic lesion involving the majority of the left lobe of the liver, previously, 4.7 x 3.5 cm on PET/CT performed 05/15/2020.  There is a minimal amount of atherosclerotic  plaque involving the origin of the common trunk of the SMA and celiac artery.  Moderate amount of mixed calcified and noncalcified atherosclerotic plaque throughout the normal caliber abdominal aorta, including evaluating this noncontrast examination.  Patient's LFTs remain normal (last labs from 08/27/20).  Given the size of the hypermetabolic liver lesion (greater than 3 cm), the patient is NOT a candidate for ablation therapy.  As such, prolonged conversations were held with the patient regarding potential transcatheter treatment options, specifically, Y 90 radioembolization.  It was explained that transcatheter treatment options are performed for palliative and not curative purposes.  The hope is that the embolization will result in disease stability and/or improvement hopefully for several months time.  Benefits and risks (including but not limited to bleeding, arterial injury, nontarget embolization and worsening hepatic function) was discussed at length with the patient.   I explained that the Y 90 radio embolization entails a mapping procedure at which time vessels taking blood supply away from the liver may require embolization followed by a planning dose of nontherapeutic radioactivity to ensure nonhepatic arterial distribution and that there is not a significant hepato-pulmonary shunt.  Following this prolonged and detailed conversation the patient  and the patient's wife wish to pursue evaluation for Y 90 radioembolization.  As such, we will initially proceed with obtaining a preprocedural planning CTA of the abdomen and pelvis.  Ultimately, both the planning and eventual Y 90 radioembolization will be performed at [facility] with conscious sedation.  All procedures were performed on an outpatient basis.  The patient and the patient's wife demonstrated excellent understanding of above plan of care and know to call the interventional radiology clinic with any interval  questions or concerns.  Plan: - Obtain planning CTA of the abdomen and pelvis. - Schedule mapping Y 90 radioembolization followed by definitive Y 90 radioembolization of the left lobe of the liver (potentially requiring 2 treatment sessions).  Thank you for this interesting consult.  I greatly enjoyed [place] and look forward to participating in their care.  A copy of this report was sent to the requesting provider on this date.  Electronically Signed: Sandi Mariscal 08/30/2020, 11:03 AM   I spent a total of 30 Minutes in remote  clinical consultation, greater than 50% of which was counseling/coordinating care for metastatic rectal cancer.    Visit type: Audio only (telephone). Audio (no video) only due to patient's lack of internet/smartphone capability. Alternative for in-person consultation at [facility], [place] [address] [place], [place], [place]. This visit type was conducted due to national recommendations for restrictions regarding the COVID-19 Pandemic (e.g. social distancing).  This format is felt to be most appropriate for this patient at this time.  All issues noted in this document were discussed and addressed.

## 2020-08-31 ENCOUNTER — Other Ambulatory Visit: Payer: Self-pay | Admitting: *Deleted

## 2020-08-31 DIAGNOSIS — C787 Secondary malignant neoplasm of liver and intrahepatic bile duct: Secondary | ICD-10-CM

## 2020-10-04 ENCOUNTER — Ambulatory Visit (HOSPITAL_COMMUNITY)
Admission: RE | Admit: 2020-10-04 | Payer: Medicare Other | Source: Ambulatory Visit | Attending: Cardiovascular Disease | Admitting: Cardiovascular Disease

## 2020-11-02 ENCOUNTER — Other Ambulatory Visit: Payer: Self-pay | Admitting: Interventional Radiology

## 2020-11-02 ENCOUNTER — Other Ambulatory Visit: Payer: Self-pay | Admitting: *Deleted

## 2020-11-02 DIAGNOSIS — C20 Malignant neoplasm of rectum: Secondary | ICD-10-CM

## 2020-11-13 ENCOUNTER — Encounter (HOSPITAL_COMMUNITY): Payer: Medicare Other

## 2020-11-22 ENCOUNTER — Encounter: Payer: Self-pay | Admitting: *Deleted

## 2020-11-22 ENCOUNTER — Ambulatory Visit
Admission: RE | Admit: 2020-11-22 | Discharge: 2020-11-22 | Disposition: A | Discharge status: Home / Self Care | Payer: Medicare Other | Source: Ambulatory Visit | Attending: Interventional Radiology | Admitting: Interventional Radiology

## 2020-11-22 DIAGNOSIS — C20 Malignant neoplasm of rectum: Secondary | ICD-10-CM

## 2020-11-22 HISTORY — PX: IR RADIOLOGIST EVAL & MGMT: IMG5224

## 2020-11-22 NOTE — Progress Notes (Signed)
Patient ID: Reginald Wright, male   DOB: 03-Dec-1950, 70 y.o.   MRN: [id number]         Chief Complaint: Metastatic rectal cancer.  Referring Physician(s): Chinnasami,Bernard  History of Present Illness: Reginald Wright is a 70 y.o. male with past medical history significant for CAD (post coronary stent placement), carotid stenosis, hypertension, hyperlipidemia and rectal cancer, metastatic to the liver.  In brief review, the patient underwent neoadjuvant chemoradiation therapy in the past in the adjuvant settingandFOLOFOX therapy post resection. Unfortunately now has stage IV diseaseand isonAvastin plus FOLFIRI therapy however despite treatment, PET/CT performed 08/17/2020 demonstrated progression of size and metabolic activity associated with the dominant lesion replacing majority of the left lobe of the liver.  Importantly, the PET CT was negative for definitive evidence of extrahepatic metastatic disease as left upper lobe pulmonary nodule is currently without metabolic activity.  As such, the patient was seen in consultation on 08/20/2020 for liver directed therapy and underwent planning Y 90 radioembolization on 09/25/2020 with subsequent Y 90 radioembolization of the left lobe of the liver on 10/10/2020.  The patient is seen today in follow-up consultation.  The patient states that he experienced approximately 3 to 4 days of right upper quadrant/diaphragmatic abdominal pain which he states was worse with deep inspirations though resolved without intervention.  While improved, patient states that his energy level remains depressed though he has been recently able to increase his activity including resumed walking for exercise and leisure.  Patient denies development of a rash (important given the identification of the falciform artery on treatment arteriogram).   Past Medical History:  Diagnosis Date  . CAD (coronary artery disease)    HX. stent 2009 (also last cath), last nuc.stress  06/06/2008 improved, non ischemic secondary to stent and EF 66%   . Carotid stenosis, asymptomatic    mod. Rt. ICA -60% by doppler, + catotid bruit  . Gout   . History of Helicobacter pylori infection 2009   treated  . HTN (hypertension)   . Hyperlipidemia     Past Surgical History:  Procedure Laterality Date  . IR RADIOLOGIST EVAL & MGMT  08/30/2020  . PERCUTANEOUS CORONARY STENT INTERVENTION (PCI-S)  03/09/2008   Promus, DES to LAD, residual disease 60% OM lesion treated medically    Allergies: Patient has no known allergies.  Medications: Prior to Admission medications   Medication Sig Start Date End Date Taking? Authorizing Provider  allopurinol (ZYLOPRIM) 300 MG tablet Take 300 mg by mouth daily.    [provider]  aspirin 81 MG tablet Take 81 mg by mouth daily.    [provider]  metoprolol tartrate (LOPRESSOR) 25 MG tablet Take 25 mg by mouth 2 (two) times daily.    [provider]  NITROSTAT 0.6 MG SL tablet Place 0.6 mg under the tongue every 5 (five) minutes as needed.  04/28/13   [provider]  ondansetron (ZOFRAN-ODT) 4 MG disintegrating tablet DIS 1 T ON THE TONGUE Q 8 H PRN NAUSEA OR VOM 07/30/16   [provider]  ramipril (ALTACE) 5 MG tablet Take 5 mg by mouth daily.    [provider]  tamsulosin (FLOMAX) 0.4 MG CAPS capsule  08/17/16   [provider]     No family history on file.  Social History   Socioeconomic History  . Marital status: Married    Spouse name: Not on file  . Number of children: 1  . Years of education: Not on file  .  Highest education level: Not on file  Occupational History  . Occupation: retired Ambulance person: [employer]  Tobacco Use  . Smoking status: Former Smoker    Quit date: 11/12/1992    Years since quitting: 28.0  . Smokeless tobacco: Never Used  Substance and Sexual Activity  . Alcohol use: Yes    Alcohol/week: 21.0 standard drinks     Types: 21 Cans of beer per week  . Drug use: No  . Sexual activity: Not on file  Other Topics Concern  . Not on file  Social History Narrative  . Not on file   Social Determinants of Health   Financial Resource Strain: Not on file  Food Insecurity: Not on file  Transportation Needs: Not on file  Physical Activity: Not on file  Stress: Not on file  Social Connections: Not on file    ECOG Status: 2 - Symptomatic, <50% confined to bed  Review of Systems  Review of Systems: A 12 point ROS discussed and pertinent positives are indicated in the HPI above.  All other systems are negative.  Physical Exam No direct physical exam was performed (except for noted visual exam findings with Video Visits).   Vital Signs: There were no vitals taken for this visit.  Imaging:  Selected images from the following examinations were reviewed in detail.  PET/CT - 08/17/2020 CTA abdomen - 09/10/2020 Pre-Y 90 mapping radioembolization and postprocedural nuclear medicine imaging - 09/25/2020 Y 90 radioembolization of the left lobe of the liver and postprocedural nuclear medicine imaging - 10/10/2020  No results found.  Labs:  CBC: No results for input(s): WBC, HGB, HCT, PLT in the last 8760 hours.  COAGS: No results for input(s): INR, APTT in the last 8760 hours.  BMP: No results for input(s): NA, K, CL, CO2, GLUCOSE, BUN, CALCIUM, CREATININE, GFRNONAA, GFRAA in the last 8760 hours.  Invalid input(s): CMP  LIVER FUNCTION TESTS: No results for input(s): BILITOT, AST, ALT, ALKPHOS, PROT, ALBUMIN in the last 8760 hours.  TUMOR MARKERS: No results for input(s): AFPTM, CEA, CA199, CHROMGRNA in the last 8760 hours.  Assessment and Plan:  Reginald Wright is a 70 y.o. male with past medical history significant for CAD (post coronary stent placement), carotid stenosis, hypertension, hyperlipidemia and rectal cancer, metastatic to the liver post planning Y 90 radioembolization on 09/25/2020 with  subsequent Y 90 radioembolization of the left lobe of the liver on 10/10/2020.  The patient is seen today in follow-up consultation.  The patient states that he experienced approximately 3 to 4 days of right upper quadrant/diaphragmatic abdominal pain which he states was worse with deep inspirations though resolved without intervention.  I explained that this is an expected finding given the location of the treated lesion within the dome of the left lobe of the liver and was happy to hear that it resolved without dedicated intervention.  While improved, the patient states that his energy level remains depressed though he has been recently able to increase his activity including resumed walking for exercise and leisure which I encouraged.   Selected images from the following examinations were reviewed in detail.  PET/CT - 08/17/2020 CTA abdomen - 09/10/2020 Pre-Y 90 mapping radioembolization and postprocedural nuclear medicine imaging - 09/25/2020 Y 90 radioembolization of the left lobe of the liver and postprocedural nuclear medicine imaging - 10/10/2020  Postprocedural nuclear medicine imaging following Y 90 radioembolization of the left lobe a technically excellent result with expected radioactivity throughout the left  lobe of the liver at location of previously noted hypermetabolic disease.  No definitive extrahepatic activity identified with special attention paid to the anterior abdominal wall given the presence of the falciform artery demonstrated on treatment arteriogram.  As preprocedural surveillance imaging has predominantly been performed with PET/CT, I will initially pursue a PET/CT 3 months following the embolization (per late April/early May 2022) and will reserve either CT or MRI for problem-solving purposes.  While the patient does have additional lesions within the right lobe of the liver, they are small and previously were not hypermetabolic and therefore do NOT warrant Y-90  embolization at this time as it would be a global dose to the right lobe instead of a segmentectomy given the disparate location of the lesions.  Though note, the patient MAY be a candidate for right-sided right-sided radioembolization if the lesions were to grow or become hypermetabolic.  Above was discussed at great length with the patient who demonstrated excellent understanding and is agreement with the proposed plan of care.  The patient knows to call the interventional radiology clinic with any interval questions or concerns.  PLAN: - Follow-up telemedicine consultation following acquisition of initial postprocedural PET/CT (to be performed late April/early May 2022) and CMP level.  Thank you for this interesting consult.  I greatly enjoyed [place] and look forward to participating in their care.  A copy of this report was sent to the requesting provider on this date.  Electronically Signed: Sandi Mariscal 11/22/2020, 9:16 AM   I spent a total of 15 Minutes in remote  clinical consultation, greater than 50% of which was counseling/coordinating care for post Y 90 radioembolization of the left lobe of the liver.    Visit type: Audio only (telephone). Audio (no video) only due to patient's lack of internet/smartphone capability. Alternative for in-person consultation at [facility], [address] [place], [place], [place]. This visit type was conducted due to national recommendations for restrictions regarding the COVID-19 Pandemic (e.g. social distancing).  This format is felt to be most appropriate for this patient at this time.  All issues noted in this document were discussed and addressed.

## 2020-12-10 ENCOUNTER — Other Ambulatory Visit: Payer: Self-pay

## 2020-12-10 ENCOUNTER — Ambulatory Visit (HOSPITAL_COMMUNITY)
Admission: RE | Admit: 2020-12-10 | Discharge: 2020-12-10 | Disposition: A | Discharge status: Home / Self Care | Payer: Medicare Other | Source: Ambulatory Visit | Attending: Cardiology | Admitting: Cardiology

## 2020-12-10 DIAGNOSIS — I6523 Occlusion and stenosis of bilateral carotid arteries: Secondary | ICD-10-CM

## 2020-12-26 ENCOUNTER — Other Ambulatory Visit: Payer: Self-pay | Admitting: Interventional Radiology

## 2020-12-26 DIAGNOSIS — C787 Secondary malignant neoplasm of liver and intrahepatic bile duct: Secondary | ICD-10-CM

## 2020-12-26 DIAGNOSIS — C20 Malignant neoplasm of rectum: Secondary | ICD-10-CM

## 2021-01-25 ENCOUNTER — Other Ambulatory Visit: Payer: Self-pay

## 2021-01-25 ENCOUNTER — Encounter (HOSPITAL_COMMUNITY)
Admission: RE | Admit: 2021-01-25 | Discharge: 2021-01-25 | Disposition: A | Discharge status: Home / Self Care | Payer: Medicare Other | Source: Ambulatory Visit | Attending: Interventional Radiology | Admitting: Interventional Radiology

## 2021-01-25 DIAGNOSIS — C787 Secondary malignant neoplasm of liver and intrahepatic bile duct: Secondary | ICD-10-CM | POA: Diagnosis present

## 2021-01-25 DIAGNOSIS — C20 Malignant neoplasm of rectum: Secondary | ICD-10-CM | POA: Diagnosis not present

## 2021-01-25 DIAGNOSIS — Z79899 Other long term (current) drug therapy: Secondary | ICD-10-CM | POA: Insufficient documentation

## 2021-01-25 LAB — GLUCOSE, CAPILLARY: Glucose-Capillary: 108 mg/dL — ABNORMAL HIGH (ref 70–99)

## 2021-01-25 MED ORDER — FLUDEOXYGLUCOSE F - 18 (FDG) INJECTION
8.7000 | Freq: Once | INTRAVENOUS | Status: AC | PRN
  Administered 2021-01-25: 7.7 via INTRAVENOUS

## 2021-01-31 ENCOUNTER — Other Ambulatory Visit: Payer: Self-pay

## 2021-01-31 ENCOUNTER — Encounter: Payer: Self-pay | Admitting: *Deleted

## 2021-01-31 ENCOUNTER — Ambulatory Visit
Admission: RE | Admit: 2021-01-31 | Discharge: 2021-01-31 | Disposition: A | Discharge status: Home / Self Care | Payer: Medicare Other | Source: Ambulatory Visit | Attending: Interventional Radiology | Admitting: Interventional Radiology

## 2021-01-31 DIAGNOSIS — C787 Secondary malignant neoplasm of liver and intrahepatic bile duct: Secondary | ICD-10-CM

## 2021-01-31 HISTORY — PX: IR RADIOLOGIST EVAL & MGMT: IMG5224

## 2021-01-31 NOTE — Progress Notes (Signed)
Patient ID: Reginald Wright, male   DOB: [birth year], 70 y.o.   MRN: [id number]         Chief Complaint: Metastatic rectal cancer.  Referring Physician(s): Chinnasami,Bernard  History of Present Illness: Reginald Wright a 70 y.o.malewith past medical history significant for CAD (post coronary stent placement), carotid stenosis, hypertension, hyperlipidemia and rectal cancer, metastatic to the liver.  In brief review, the patient underwent neoadjuvant chemoradiation therapy in the past in the adjuvant settingandFOLOFOX therapy post resection. Unfortunately now has stage IV diseaseand isonAvastin plus FOLFIRI therapyhowever despite treatment, PET/CT performed 08/17/2020 demonstrated progression of size and metabolic activity associated with the dominant lesion replacing majority of the left lobe of the liver.  Importantly, the PET CT was negative for definitive evidence of extrahepatic metastatic disease as left upper lobe pulmonary nodule is currently without metabolic activity.  As such, the patient underwent planning Y-90 radioembolization on 09/25/2020 with subsequent Y 90 radioembolization of the left lobe of the liver on 10/10/2020.  The patient is seen today in follow-up telemedicine consultation accompanied on the phone call by his wife, [name], following acquisition of postprocedural PET/CT performed 01/25/2021.  Patient continues to complain of fatigue.  She also reports intermittent constipation.  Patient reports development of right shoulder pain which he attributes to a remote injury he experienced high school.  Patient otherwise without complaint.  Specifically, no yellowing of the skin or eyes.  No increased abdominal girth.   Past Medical History:  Diagnosis Date  . CAD (coronary artery disease)    HX. stent 2009 (also last cath), last nuc.stress 06/06/2008 improved, non ischemic secondary to stent and EF 66%   . Carotid stenosis, asymptomatic    mod. Rt. ICA -60% by  doppler, + catotid bruit  . Gout   . History of Helicobacter pylori infection 2009   treated  . HTN (hypertension)   . Hyperlipidemia     Past Surgical History:  Procedure Laterality Date  . IR RADIOLOGIST EVAL & MGMT  08/30/2020  . IR RADIOLOGIST EVAL & MGMT  11/22/2020  . PERCUTANEOUS CORONARY STENT INTERVENTION (PCI-S)  03/09/2008   Promus, DES to LAD, residual disease 60% OM lesion treated medically    Allergies: Patient has no known allergies.  Medications: Prior to Admission medications   Medication Sig Start Date End Date Taking? Authorizing Provider  allopurinol (ZYLOPRIM) 300 MG tablet Take 300 mg by mouth daily.    [provider]  aspirin 81 MG tablet Take 81 mg by mouth daily.    [provider]  metoprolol tartrate (LOPRESSOR) 25 MG tablet Take 25 mg by mouth 2 (two) times daily.    [provider]  NITROSTAT 0.6 MG SL tablet Place 0.6 mg under the tongue every 5 (five) minutes as needed.  04/28/13   [provider]  ondansetron (ZOFRAN-ODT) 4 MG disintegrating tablet DIS 1 T ON THE TONGUE Q 8 H PRN NAUSEA OR VOM 07/30/16   [provider]  ramipril (ALTACE) 5 MG tablet Take 5 mg by mouth daily.    [provider]  tamsulosin (FLOMAX) 0.4 MG CAPS capsule  08/17/16   [provider]     No family history on file.  Social History   Socioeconomic History  . Marital status: Married    Spouse name: Not on file  . Number of children: 1  . Years of education: Not on file  . Highest education level: Not on file  Occupational History  . Occupation: retired  quality assurance    Employer: [employer]  Tobacco Use  . Smoking status: Former Smoker    Quit date: 11/12/1992    Years since quitting: 28.2  . Smokeless tobacco: Never Used  Substance and Sexual Activity  . Alcohol use: Yes    Alcohol/week: 21.0 standard drinks    Types: 21 Cans of beer per week  . Drug use: No  . Sexual activity: Not on file   Other Topics Concern  . Not on file  Social History Narrative  . Not on file   Social Determinants of Health   Financial Resource Strain: Not on file  Food Insecurity: Not on file  Transportation Needs: Not on file  Physical Activity: Not on file  Stress: Not on file  Social Connections: Not on file    ECOG Status: 1 - Symptomatic but completely ambulatory  Review of Systems  Review of Systems: A 12 point ROS discussed and pertinent positives are indicated in the HPI above.  All other systems are negative.  Physical Exam No direct physical exam was performed (except for noted visual exam findings with Video Visits).   Vital Signs: There were no vitals taken for this visit.  Imaging:  PET/CT-01/25/2021; 08/17/2020; abdominal CTA- 09/10/2020  Unfortunately personal review of PET/CT performed 01/25/2021 demonstrates marked progression of hepatic metastatic disease as well as developments of extensive extrahepatic metastatic disease including nearly confluent hypermetabolic retroperitoneal adenopathy as well as hypermetabolic mediastinal adenopathy and development of a hypermetabolic lesion involving the lateral aspect of the left seventh rib.   NM PET Image Initial (PI) Skull Base To Thigh  Result Date: 01/28/2021 CLINICAL DATA:  Subsequent treatment strategy for colorectal carcinoma. EXAM: NUCLEAR MEDICINE PET SKULL BASE TO THIGH TECHNIQUE: 7.7 mCi F-18 FDG was injected intravenously. Full-ring PET imaging was performed from the skull base to thigh after the radiotracer. CT data was obtained and used for attenuation correction and anatomic localization. Fasting blood glucose: 108 mg/dl COMPARISON:  PET-CT scan 08/17/2020 FINDINGS: Mediastinal blood pool activity: SUV max 2.5 Liver activity: SUV max NA NECK: No hypermetabolic lymph nodes in the neck. Incidental CT findings: none CHEST: new intensely hypermetabolic mediastinal adenopathy. lymph node along the descending thoracic aorta  the level of the carina measures 9 mm with suv max equal 15.6. new hypermetabolic node in the substernal anterior mediastinal fat measuring 6 mm suv max equal 11.7. hypermetabolic node in the precordial fat adjacent to the right atrium measuring 17 mm with suv max equal 19.5. mild metabolic activity associated the left infrahilar nodule Incidental CT findings: none ABDOMEN/PELVIS: Multiple new intense hypermetabolic lesions within the LEFT and RIGHT hepatic lobe. Lesions correspond with new hypo dense lesions on the noncontrast CT. Lesions number approximately 30 lesions measuring from 10-30 mm. Example lesion in the RIGHT hepatic lobe (image 113) measures 3.4 cm with SUV max equal 14.7. Lesion in the anterior aspect of the LEFT hepatic lobe measuring 4.3 cm with SUV max equal 19.2. Lesion the dome of the liver measuring approximately 3 cm with SUV max equal 15.2. The treated lesion in LEFT hepatic lobe has reduced metabolic activity and subcapsular retraction SUV max equal 7.0 compared to SUV max equal 14.6. New hypermetabolic periaortic upper abdominal retroperitoneal lymph nodes. Example lymph node ventral to the aorta measuring 15 mm SUV max equal 9.1. No adenopathy in the pelvis. Incidental CT findings: LEFT lower quadrant ostomy. SKELETON: Solitary skeletal metastasis to the LEFT lateral seventh rib SUV max equal 7.2. Incidental CT findings: none  IMPRESSION: 1. Unfortunately there has been rapid progression of multifocal colorectal cancer progression. 2. Progression includes new hypermetabolic mediastinal lymph nodes. 3. Multiple new hypermetabolic lesions in LEFT and RIGHT hepatic lobe. 4. Retraction and decreased metabolic activity radioembolization site in the LEFT hepatic lobe. 5. New hypermetabolic upper abdominal retroperitoneal nodal metastasis. 6. New solitary skeletal metastasis in the LEFT lateral rib. Electronically Signed   By: Suzy Bouchard M.D.   On: 01/28/2021 09:21    Labs:  CBC: No  results for input(s): WBC, HGB, HCT, PLT in the last 8760 hours.  COAGS: No results for input(s): INR, APTT in the last 8760 hours.  BMP: No results for input(s): NA, K, CL, CO2, GLUCOSE, BUN, CALCIUM, CREATININE, GFRNONAA, GFRAA in the last 8760 hours.  Invalid input(s): CMP  LIVER FUNCTION TESTS: Lab values from 5/10    TUMOR MARKERS: No results for input(s): AFPTM, CEA, CA199, CHROMGRNA in the last 8760 hours.  Assessment and Plan:  Reginald Wright a 70 y.o.malewith past medical history significant for CAD (post coronary stent placement), carotid stenosis, hypertension, hyperlipidemia and rectal cancer, metastatic to the liver.  In brief review, the patient underwent neoadjuvant chemoradiation therapy in the past in the adjuvant settingandFOLOFOX therapy post resection. Unfortunately now has stage IV diseaseand isonAvastin plus FOLFIRI therapyhowever despite treatment, PET/CT performed 08/17/2020 demonstrated progression of size and metabolic activity associated with the dominant lesion replacing majority of the left lobe of the liver.  Importantly, the PET CT was negative for definitive evidence of extrahepatic metastatic disease as left upper lobe pulmonary nodule is currently without metabolic activity. As such, the patient underwent planning Y 90 radioembolization on 09/25/2020 with subsequent Y 90 radioembolization of the left lobe of the liver on 10/10/2020.  Unfortunately personal review of PET/CT performed 01/25/2021 demonstrates marked progression of hepatic metastatic disease as well as developments of extensive extrahepatic metastatic disease including nearly confluent hypermetabolic retroperitoneal adenopathy as well as hypermetabolic mediastinal adenopathy and development of a hypermetabolic lesion involving the lateral aspect of the left seventh rib.  In regards to his right shoulder pain, I believe this is likely attributable to rapid progression of hepatic metastatic  disease including several of the lesions tenting the liver capsule as well as the undersurface of the diaphragm.  In regards to his complaint of constipation, patient does have a large stool burden but also has a small parastomal hernia which contains a nondilated loops of bowel.  While there is some retraction and decreased metabolic activity associated with the left lobe liver given rapid progression of additional sites of hepatic metastatic disease as well as significant extrahepatic disease I do not feel the patient would benefit from continued hepatic directed therapy and as such would defer to Dr. Wendee Beavers i for consideration of additional systemic treatment options.  This was discussed at length with the patient and the patient's wife who is in agreement with the plan of care.  Plan: - Refer patient back to Dr. Wendee Beavers for consideration of systemic therapy as the patient is no longer a candidate for hepatic directed therapy given marked progression of extrahepatic metastatic disease.  A copy of this report was sent to the requesting provider on this date.  Electronically Signed: Sandi Mariscal 01/31/2021, 9:37 AM   I spent a total of 15 Minutes in remote  clinical consultation, greater than 50% of which was counseling/coordinating care for metastatic rectal cancer, post Y 90 radioembolization.    Visit type: Audio only (telephone). Audio (no video) only due  to patient's lack of internet/smartphone capability. Alternative for in-person consultation at [facility], [address], [place], [place]. This visit type was conducted due to national recommendations for restrictions regarding the COVID-19 Pandemic (e.g. social distancing).  This format is felt to be most appropriate for this patient at this time.  All issues noted in this document were discussed and addressed.

## 2021-03-15 DEATH — deceased
